# Patient Record
Sex: Female | Born: 1985
Health system: Southern US, Community
[De-identification: ages and names within clinical notes are randomized; demographics above are authoritative.]

## PROBLEM LIST (undated history)

## (undated) DIAGNOSIS — F419 Anxiety disorder, unspecified: Secondary | ICD-10-CM

## (undated) HISTORY — DX: Anxiety disorder, unspecified: F41.9

---
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
## 2007-12-11 IMAGING — CT CT ABDOMEN W/O CM
2 of 4 series · 17 of 46 positions shown, 19 images · IV contrast (agent unspecified)
Comparison: none

CLINICAL DATA: Right flank pain.
 ABDOMEN CT WITHOUT CONTRAST:
TECHNIQUE: Multidetector CT imaging of the abdomen was performed following the standard protocol without IV contrast.
TECHNIQUE: Multidetector CT imaging of the pelvis was performed following the standard protocol without IV contrast.

[Series 2: stone_wo 5.0 b40f st · axial · 0.53mm/px · z∈[-254,+94]mm · 14 of 95 slices shown, 16 images]
[im 4/95  soft-tissue]
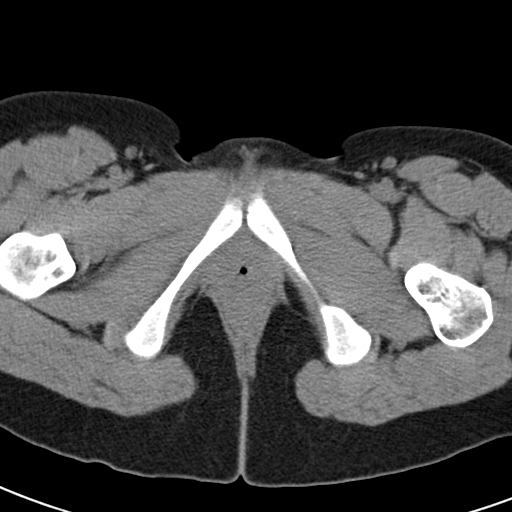
[im 4/95  bone]
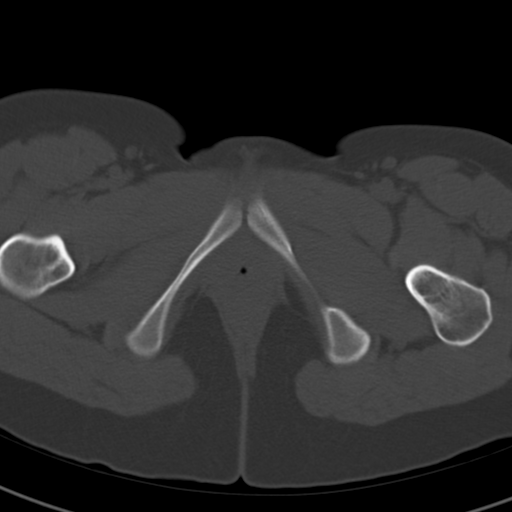
[im 11/95  soft-tissue]
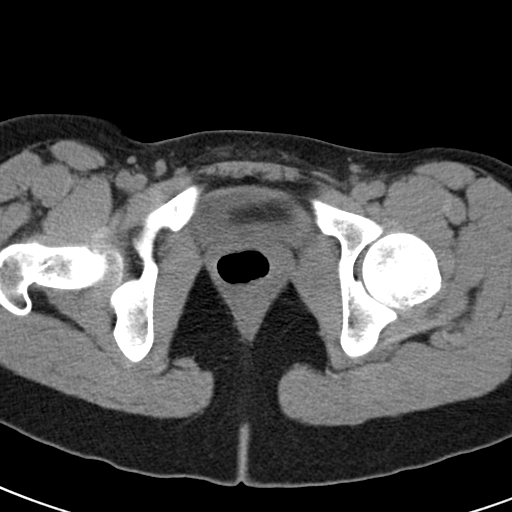
[im 18/95  soft-tissue]
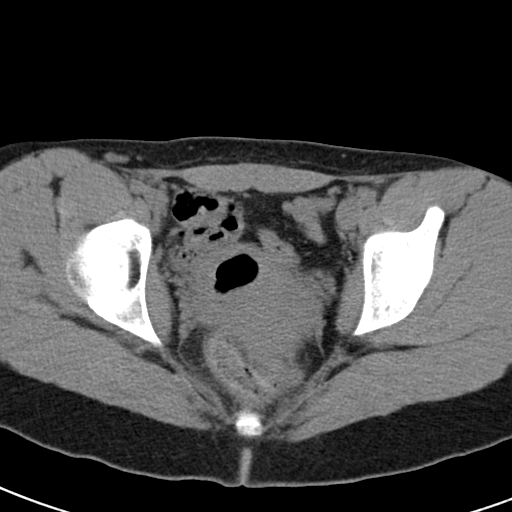
[im 25/95  soft-tissue]
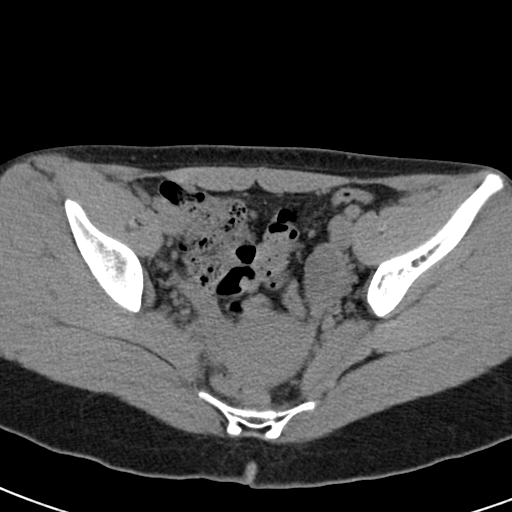
[im 32/95  soft-tissue]
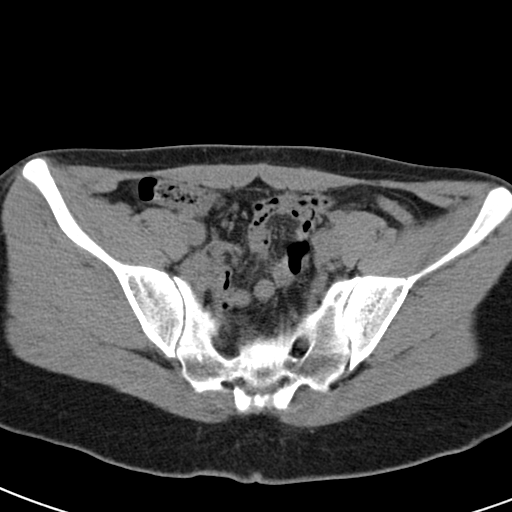
[im 39/95  soft-tissue]
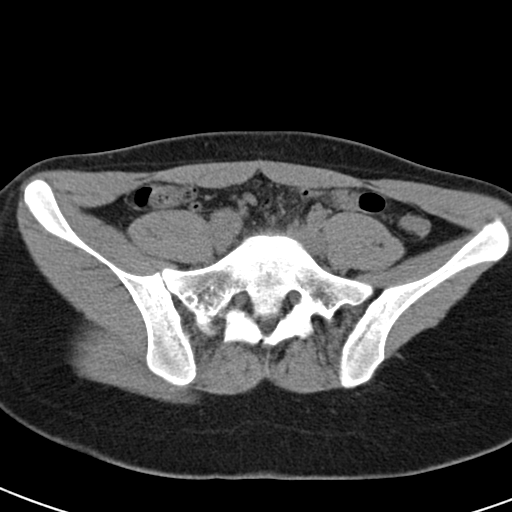
[im 46/95  soft-tissue]
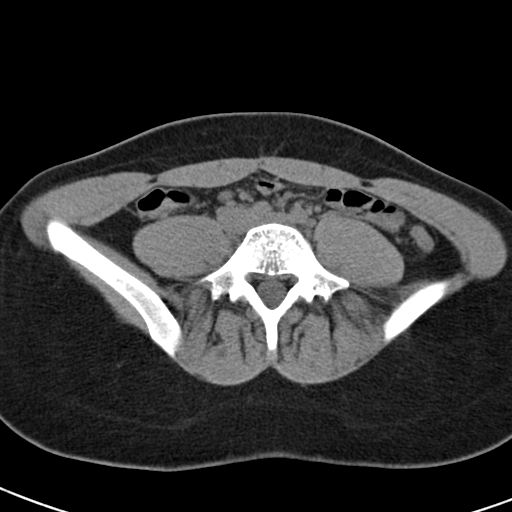
[im 49/95  soft-tissue]
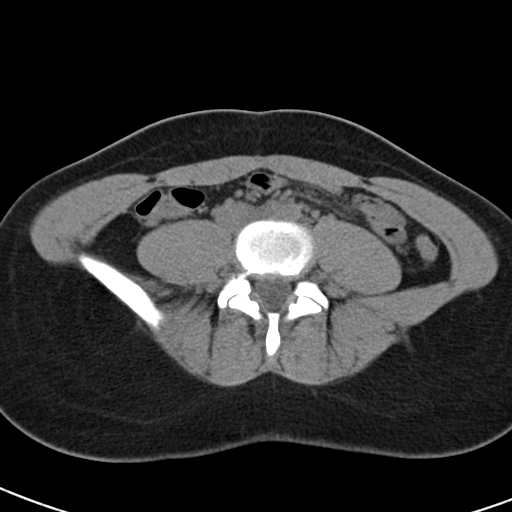
[im 56/95  soft-tissue]
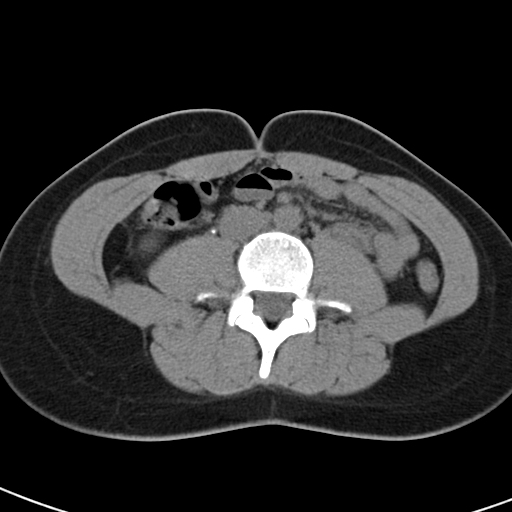
[im 56/95  bone]
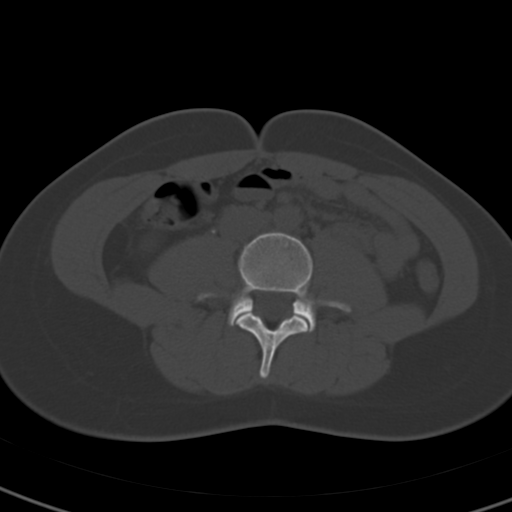
[im 63/95  soft-tissue]
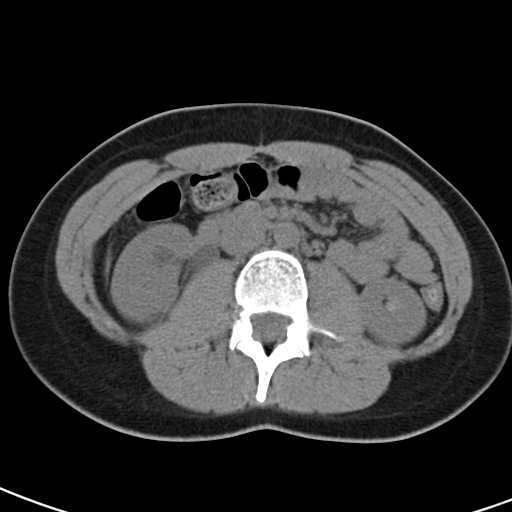
[im 70/95  soft-tissue]
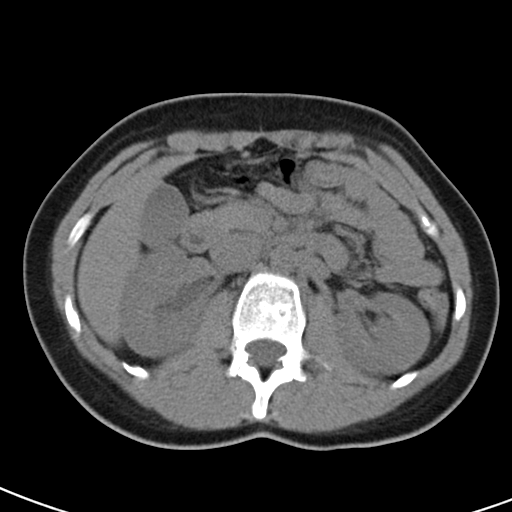
[im 77/95  soft-tissue]
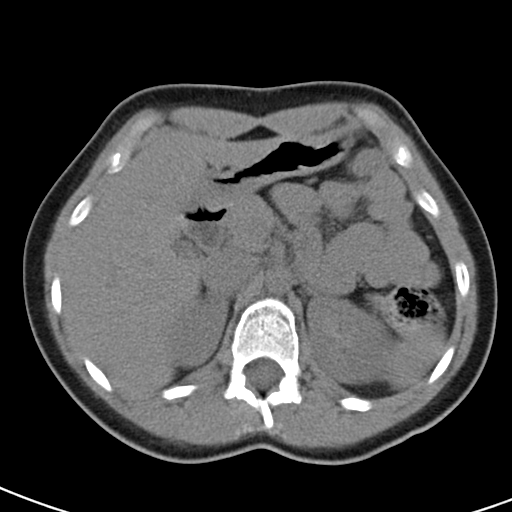
[im 84/95  soft-tissue]
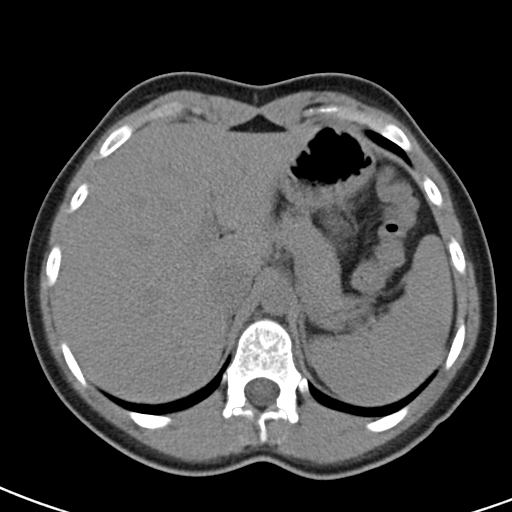
[im 91/95  soft-tissue]
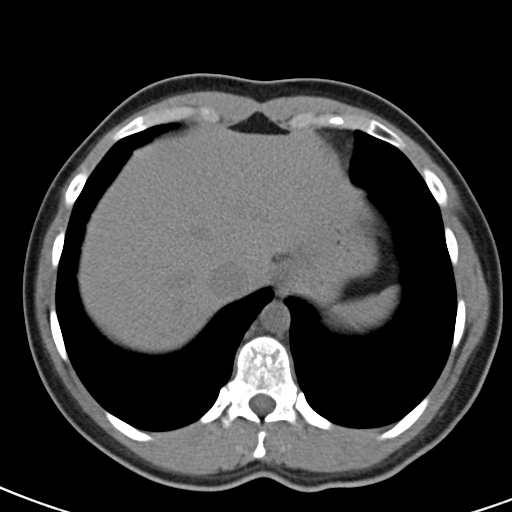

[Series 602: coronal · coronal · 0.77mm/px · 3 of 54 slices shown]
[im 18/54  soft-tissue]
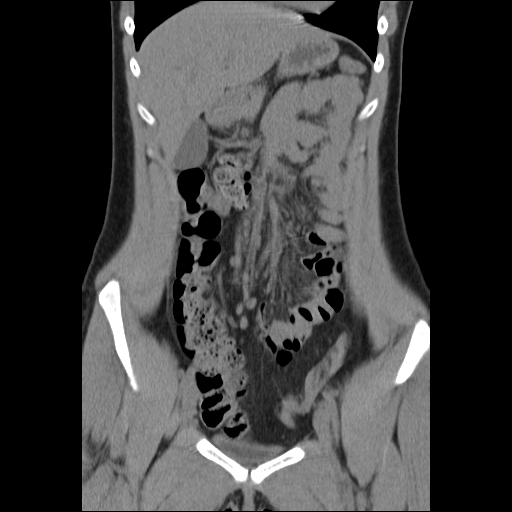
[im 24/54  soft-tissue]
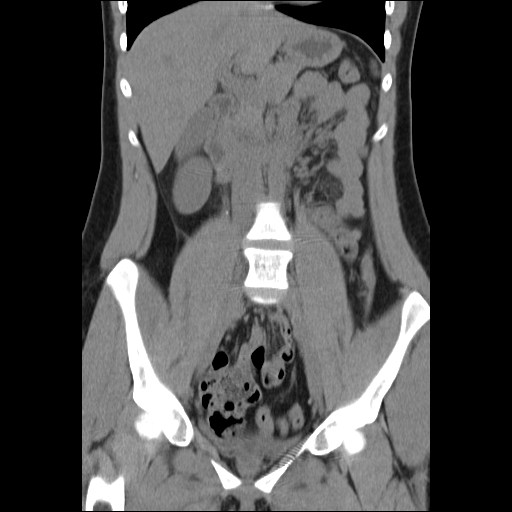
[im 30/54  soft-tissue]
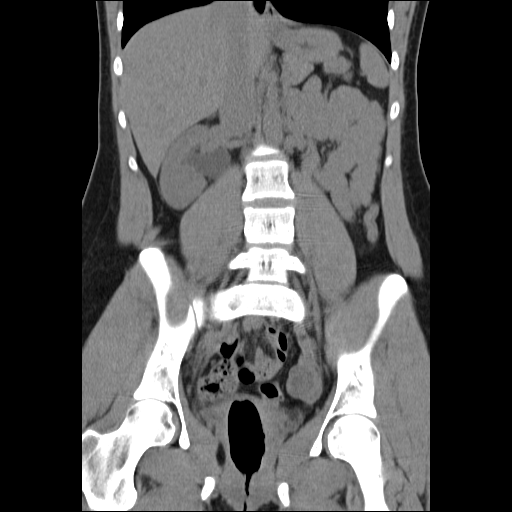

[17 of 46 positions shown; findings below may reference images not displayed]

FINDINGS: The lung bases are clear.  There is a mild right hydronephrosis and proximal hydroureter at point of obstruction by a 2 mm mid proximal right ureteral calculus.  No other definite renal calculi are seen.  The remainder of the study shows the liver to appear normal in the unenhanced state.  No calcified gallstones are seen.  The pancreas, adrenal glands and spleen appear normal.  The abdominal aorta is normal in caliber.
IMPRESSION: 1. Mild to moderate right hydronephrosis caused by mid proximal 2 mm right ureteral calculus.
 2. No other renal calculi are noted.
 PELVIS CT WITHOUT CONTRAST:
FINDINGS: Scans were continued through the pelvis in the unenhanced state.  The distal ureters are normal in caliber, and no distal ureteral calculi are noted.  The urinary bladder is decompressed.  The uterus is normal in size. Ovarian follicles are present, left more numerous than right.  No free fluid is noted.  The appendix appears normal.
IMPRESSION: No distal ureteral calculi are noted.  The appendix appears normal.

## 2017-04-05 DIAGNOSIS — F411 Generalized anxiety disorder: Secondary | ICD-10-CM | POA: Diagnosis not present

## 2017-04-15 DIAGNOSIS — F411 Generalized anxiety disorder: Secondary | ICD-10-CM | POA: Diagnosis not present

## 2017-04-19 DIAGNOSIS — Z0000 Encounter for general adult medical examination without abnormal findings: Secondary | ICD-10-CM | POA: Diagnosis not present

## 2017-04-19 DIAGNOSIS — R6889 Other general symptoms and signs: Secondary | ICD-10-CM | POA: Diagnosis not present

## 2017-04-19 DIAGNOSIS — R635 Abnormal weight gain: Secondary | ICD-10-CM | POA: Diagnosis not present

## 2017-04-19 DIAGNOSIS — R5383 Other fatigue: Secondary | ICD-10-CM | POA: Diagnosis not present

## 2017-04-20 DIAGNOSIS — F411 Generalized anxiety disorder: Secondary | ICD-10-CM | POA: Diagnosis not present

## 2017-04-29 DIAGNOSIS — F411 Generalized anxiety disorder: Secondary | ICD-10-CM | POA: Diagnosis not present

## 2017-05-02 DIAGNOSIS — F329 Major depressive disorder, single episode, unspecified: Secondary | ICD-10-CM | POA: Diagnosis not present

## 2017-05-05 DIAGNOSIS — F411 Generalized anxiety disorder: Secondary | ICD-10-CM | POA: Diagnosis not present

## 2017-05-09 DIAGNOSIS — F411 Generalized anxiety disorder: Secondary | ICD-10-CM | POA: Diagnosis not present

## 2017-05-13 DIAGNOSIS — F329 Major depressive disorder, single episode, unspecified: Secondary | ICD-10-CM | POA: Diagnosis not present

## 2017-05-19 DIAGNOSIS — F411 Generalized anxiety disorder: Secondary | ICD-10-CM | POA: Diagnosis not present

## 2017-06-02 DIAGNOSIS — F411 Generalized anxiety disorder: Secondary | ICD-10-CM | POA: Diagnosis not present

## 2017-06-07 DIAGNOSIS — F329 Major depressive disorder, single episode, unspecified: Secondary | ICD-10-CM | POA: Diagnosis not present

## 2017-06-09 DIAGNOSIS — F411 Generalized anxiety disorder: Secondary | ICD-10-CM | POA: Diagnosis not present

## 2017-06-14 DIAGNOSIS — H04123 Dry eye syndrome of bilateral lacrimal glands: Secondary | ICD-10-CM | POA: Diagnosis not present

## 2017-06-14 DIAGNOSIS — H5213 Myopia, bilateral: Secondary | ICD-10-CM | POA: Diagnosis not present

## 2017-06-16 DIAGNOSIS — F411 Generalized anxiety disorder: Secondary | ICD-10-CM | POA: Diagnosis not present

## 2017-06-29 DIAGNOSIS — F411 Generalized anxiety disorder: Secondary | ICD-10-CM | POA: Diagnosis not present

## 2017-12-16 DIAGNOSIS — Z6822 Body mass index (BMI) 22.0-22.9, adult: Secondary | ICD-10-CM | POA: Diagnosis not present

## 2017-12-16 DIAGNOSIS — Z3009 Encounter for other general counseling and advice on contraception: Secondary | ICD-10-CM | POA: Diagnosis not present

## 2017-12-16 DIAGNOSIS — Z113 Encounter for screening for infections with a predominantly sexual mode of transmission: Secondary | ICD-10-CM | POA: Diagnosis not present

## 2017-12-16 DIAGNOSIS — Z01419 Encounter for gynecological examination (general) (routine) without abnormal findings: Secondary | ICD-10-CM | POA: Diagnosis not present

## 2018-09-14 DIAGNOSIS — Z79899 Other long term (current) drug therapy: Secondary | ICD-10-CM | POA: Diagnosis not present

## 2018-09-14 DIAGNOSIS — F900 Attention-deficit hyperactivity disorder, predominantly inattentive type: Secondary | ICD-10-CM | POA: Diagnosis not present

## 2018-09-28 DIAGNOSIS — F411 Generalized anxiety disorder: Secondary | ICD-10-CM | POA: Diagnosis not present

## 2018-09-28 DIAGNOSIS — Z23 Encounter for immunization: Secondary | ICD-10-CM | POA: Diagnosis not present

## 2018-12-20 DIAGNOSIS — Z6822 Body mass index (BMI) 22.0-22.9, adult: Secondary | ICD-10-CM | POA: Diagnosis not present

## 2018-12-20 DIAGNOSIS — Z01419 Encounter for gynecological examination (general) (routine) without abnormal findings: Secondary | ICD-10-CM | POA: Diagnosis not present

## 2018-12-20 DIAGNOSIS — Z113 Encounter for screening for infections with a predominantly sexual mode of transmission: Secondary | ICD-10-CM | POA: Diagnosis not present

## 2018-12-20 DIAGNOSIS — Z3009 Encounter for other general counseling and advice on contraception: Secondary | ICD-10-CM | POA: Diagnosis not present

## 2019-02-23 DIAGNOSIS — D2261 Melanocytic nevi of right upper limb, including shoulder: Secondary | ICD-10-CM | POA: Diagnosis not present

## 2019-02-23 DIAGNOSIS — L821 Other seborrheic keratosis: Secondary | ICD-10-CM | POA: Diagnosis not present

## 2019-09-10 DIAGNOSIS — U071 COVID-19: Secondary | ICD-10-CM | POA: Diagnosis not present

## 2019-09-10 DIAGNOSIS — R079 Chest pain, unspecified: Secondary | ICD-10-CM | POA: Diagnosis not present

## 2019-09-10 DIAGNOSIS — Z20822 Contact with and (suspected) exposure to covid-19: Secondary | ICD-10-CM | POA: Diagnosis not present

## 2019-12-14 DIAGNOSIS — Z0000 Encounter for general adult medical examination without abnormal findings: Secondary | ICD-10-CM | POA: Diagnosis not present

## 2019-12-14 DIAGNOSIS — Z13220 Encounter for screening for lipoid disorders: Secondary | ICD-10-CM | POA: Diagnosis not present

## 2019-12-14 DIAGNOSIS — S0502XA Injury of conjunctiva and corneal abrasion without foreign body, left eye, initial encounter: Secondary | ICD-10-CM | POA: Diagnosis not present

## 2019-12-21 DIAGNOSIS — F411 Generalized anxiety disorder: Secondary | ICD-10-CM | POA: Diagnosis not present

## 2019-12-21 DIAGNOSIS — R7989 Other specified abnormal findings of blood chemistry: Secondary | ICD-10-CM | POA: Diagnosis not present

## 2019-12-21 DIAGNOSIS — F900 Attention-deficit hyperactivity disorder, predominantly inattentive type: Secondary | ICD-10-CM | POA: Diagnosis not present

## 2019-12-21 DIAGNOSIS — Z0000 Encounter for general adult medical examination without abnormal findings: Secondary | ICD-10-CM | POA: Diagnosis not present

## 2020-01-28 DIAGNOSIS — Z01419 Encounter for gynecological examination (general) (routine) without abnormal findings: Secondary | ICD-10-CM | POA: Diagnosis not present

## 2020-01-28 DIAGNOSIS — Z6823 Body mass index (BMI) 23.0-23.9, adult: Secondary | ICD-10-CM | POA: Diagnosis not present

## 2020-01-28 DIAGNOSIS — Z3009 Encounter for other general counseling and advice on contraception: Secondary | ICD-10-CM | POA: Diagnosis not present

## 2020-02-08 DIAGNOSIS — R7989 Other specified abnormal findings of blood chemistry: Secondary | ICD-10-CM | POA: Diagnosis not present

## 2020-02-08 DIAGNOSIS — F411 Generalized anxiety disorder: Secondary | ICD-10-CM | POA: Diagnosis not present

## 2020-02-20 DIAGNOSIS — N72 Inflammatory disease of cervix uteri: Secondary | ICD-10-CM | POA: Diagnosis not present

## 2020-02-20 DIAGNOSIS — B977 Papillomavirus as the cause of diseases classified elsewhere: Secondary | ICD-10-CM | POA: Diagnosis not present

## 2020-02-20 DIAGNOSIS — R87618 Other abnormal cytological findings on specimens from cervix uteri: Secondary | ICD-10-CM | POA: Diagnosis not present

## 2020-04-21 DIAGNOSIS — D225 Melanocytic nevi of trunk: Secondary | ICD-10-CM | POA: Diagnosis not present

## 2020-04-21 DIAGNOSIS — D2261 Melanocytic nevi of right upper limb, including shoulder: Secondary | ICD-10-CM | POA: Diagnosis not present

## 2020-04-21 DIAGNOSIS — D224 Melanocytic nevi of scalp and neck: Secondary | ICD-10-CM | POA: Diagnosis not present

## 2020-04-21 DIAGNOSIS — D2262 Melanocytic nevi of left upper limb, including shoulder: Secondary | ICD-10-CM | POA: Diagnosis not present

## 2020-06-09 DIAGNOSIS — Z3009 Encounter for other general counseling and advice on contraception: Secondary | ICD-10-CM | POA: Diagnosis not present

## 2020-12-22 DIAGNOSIS — Z0000 Encounter for general adult medical examination without abnormal findings: Secondary | ICD-10-CM | POA: Diagnosis not present

## 2020-12-24 DIAGNOSIS — Z0000 Encounter for general adult medical examination without abnormal findings: Secondary | ICD-10-CM | POA: Diagnosis not present

## 2020-12-24 DIAGNOSIS — D72819 Decreased white blood cell count, unspecified: Secondary | ICD-10-CM | POA: Diagnosis not present

## 2020-12-24 DIAGNOSIS — R7989 Other specified abnormal findings of blood chemistry: Secondary | ICD-10-CM | POA: Diagnosis not present

## 2020-12-24 DIAGNOSIS — F900 Attention-deficit hyperactivity disorder, predominantly inattentive type: Secondary | ICD-10-CM | POA: Diagnosis not present

## 2020-12-24 DIAGNOSIS — F411 Generalized anxiety disorder: Secondary | ICD-10-CM | POA: Diagnosis not present

## 2020-12-30 ENCOUNTER — Telehealth: Payer: Self-pay | Admitting: Hematology and Oncology

## 2020-12-30 NOTE — Telephone Encounter (Signed)
Scheduled appt per 8/26 referral. Pt is aware of appt date and time.

## 2021-01-01 DIAGNOSIS — F4323 Adjustment disorder with mixed anxiety and depressed mood: Secondary | ICD-10-CM | POA: Diagnosis not present

## 2021-01-12 DIAGNOSIS — F4323 Adjustment disorder with mixed anxiety and depressed mood: Secondary | ICD-10-CM | POA: Diagnosis not present

## 2021-01-19 ENCOUNTER — Encounter: Payer: Self-pay | Admitting: Hematology and Oncology

## 2021-01-19 ENCOUNTER — Inpatient Hospital Stay (HOSPITAL_BASED_OUTPATIENT_CLINIC_OR_DEPARTMENT_OTHER): Payer: BC Managed Care – PPO | Admitting: Hematology and Oncology

## 2021-01-19 ENCOUNTER — Other Ambulatory Visit: Payer: Self-pay

## 2021-01-19 ENCOUNTER — Inpatient Hospital Stay: Payer: BC Managed Care – PPO | Attending: Hematology and Oncology

## 2021-01-19 VITALS — BP 134/84 | HR 106 | Temp 98.4°F | Resp 18 | Wt 136.0 lb

## 2021-01-19 DIAGNOSIS — R590 Localized enlarged lymph nodes: Secondary | ICD-10-CM | POA: Insufficient documentation

## 2021-01-19 DIAGNOSIS — Z803 Family history of malignant neoplasm of breast: Secondary | ICD-10-CM | POA: Diagnosis not present

## 2021-01-19 DIAGNOSIS — D72819 Decreased white blood cell count, unspecified: Secondary | ICD-10-CM | POA: Diagnosis not present

## 2021-01-19 DIAGNOSIS — Z79899 Other long term (current) drug therapy: Secondary | ICD-10-CM | POA: Diagnosis not present

## 2021-01-19 DIAGNOSIS — Z880 Allergy status to penicillin: Secondary | ICD-10-CM

## 2021-01-19 DIAGNOSIS — Z808 Family history of malignant neoplasm of other organs or systems: Secondary | ICD-10-CM | POA: Insufficient documentation

## 2021-01-19 DIAGNOSIS — R7989 Other specified abnormal findings of blood chemistry: Secondary | ICD-10-CM | POA: Insufficient documentation

## 2021-01-19 DIAGNOSIS — F411 Generalized anxiety disorder: Secondary | ICD-10-CM

## 2021-01-19 DIAGNOSIS — R591 Generalized enlarged lymph nodes: Secondary | ICD-10-CM

## 2021-01-19 LAB — CMP (CANCER CENTER ONLY)
ALT: 29 U/L (ref 0–44)
AST: 25 U/L (ref 15–41)
Albumin: 4.2 g/dL (ref 3.5–5.0)
Alkaline Phosphatase: 58 U/L (ref 38–126)
Anion gap: 9 (ref 5–15)
BUN: 13 mg/dL (ref 6–20)
CO2: 27 mmol/L (ref 22–32)
Calcium: 10.2 mg/dL (ref 8.9–10.3)
Chloride: 106 mmol/L (ref 98–111)
Creatinine: 1.09 mg/dL — ABNORMAL HIGH (ref 0.44–1.00)
GFR, Estimated: >60 mL/min (ref >60)
Glucose, Bld: 103 mg/dL — ABNORMAL HIGH (ref 70–99)
Potassium: 4.1 mmol/L (ref 3.5–5.1)
Sodium: 142 mmol/L (ref 135–145)
Total Bilirubin: 0.3 mg/dL (ref 0.3–1.2)
Total Protein: 7.3 g/dL (ref 6.5–8.1)

## 2021-01-19 LAB — CBC WITH DIFFERENTIAL (CANCER CENTER ONLY)
Abs Immature Granulocytes: 0.01 10*3/uL (ref 0.00–0.07)
Basophils Absolute: 0.1 10*3/uL (ref 0.0–0.1)
Basophils Relative: 1 %
Eosinophils Absolute: 0.1 10*3/uL (ref 0.0–0.5)
Eosinophils Relative: 1 %
HCT: 39.2 % (ref 36.0–46.0)
Hemoglobin: 13.8 g/dL (ref 12.0–15.0)
Immature Granulocytes: 0 %
Lymphocytes Relative: 35 %
Lymphs Abs: 2 10*3/uL (ref 0.7–4.0)
MCH: 31.4 pg (ref 26.0–34.0)
MCHC: 35.2 g/dL (ref 30.0–36.0)
MCV: 89.1 fL (ref 80.0–100.0)
Monocytes Absolute: 0.4 10*3/uL (ref 0.1–1.0)
Monocytes Relative: 6 %
Neutro Abs: 3.1 10*3/uL (ref 1.7–7.7)
Neutrophils Relative %: 57 %
Platelet Count: 242 10*3/uL (ref 150–400)
RBC: 4.4 MIL/uL (ref 3.87–5.11)
RDW: 11.7 % (ref 11.5–15.5)
WBC Count: 5.6 10*3/uL (ref 4.0–10.5)
nRBC: 0 % (ref 0.0–0.2)

## 2021-01-19 LAB — LACTATE DEHYDROGENASE: LDH: 89 U/L — ABNORMAL LOW (ref 98–192)

## 2021-01-19 LAB — SAVE SMEAR(SSMR), FOR PROVIDER SLIDE REVIEW

## 2021-01-19 LAB — C-REACTIVE PROTEIN: CRP: 0.8 mg/dL (ref <1.0)

## 2021-01-19 LAB — SEDIMENTATION RATE: Sed Rate: 2 mm/hr (ref 0–22)

## 2021-01-19 NOTE — Progress Notes (Signed)
[facility] Telephone:([phone]) [phone]   Fax:([phone]  INITIAL CONSULT NOTE  Patient Care Team: Deland Pretty, MD as PCP - General (Internal Medicine)  Hematological/Oncological History # Cervical/Axillary Lymphadenopathy 12/22/2020: WBC 3.9, Hgb 14.1, MCV 90.5, Plt 237 01/19/2021: establish care with Dr. Lorenso Courier   CHIEF COMPLAINTS/PURPOSE OF CONSULTATION:  "Lymphadenopathy "  HISTORY OF PRESENTING ILLNESS:  Natalie Roberts 35 y.o. female with medical history significant for general anxiety disorder who presents for evaluation of mild leukopenia and left cervical/axillary lymphadenopathy noted on exam.   On review of the previous records Natalie Roberts was last seen by her PCP on 12/22/2020.  At that time she had a white blood cell count of 3.9, hemoglobin 14.1, MCV of 90.5, platelets of 237.  Additionally she was noted to have a mildly elevated creatinine at 1.07.  It was noted that she had mild lymphadenopathy in her left cervical lymph nodes and left axilla.  Due to concern for these findings the patient was referred to hematology for further evaluation and management.  On exam today Natalie Roberts reports that she first noticed a pea-sized swollen lymph node in her neck approximately 3 months ago.  She notes it took her about 2 months to get an appointment with her primary care provider who was able to feel this lymph node as well as a lymph node under her left arm.  She notes that she has not had any recent illnesses and did have her COVID-vaccine over a year ago.  She notes that the only other symptom she is having so the ordinary is that itching on her hands and feet that been chronic for 1 year and has had difficulty resolving.  It is not impacted any rashes or skin changes in her hands.  On further discussion she notes that she is a never smoker and has not had any alcohol drink in the last 6 weeks.  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  Her family history is  remarkable for breast cancer in her maternal grandmother and skin cancer in her maternal grandfather.  She otherwise denies any fevers, chills, sweats, nausea vomiting or diarrhea.  She denies any weight loss.  A full 10 point ROS is listed below.  MEDICAL HISTORY:  Past Medical History:  Diagnosis Date   Anxiety     SURGICAL HISTORY: History reviewed. No pertinent surgical history.  SOCIAL HISTORY: Social History   Socioeconomic History   Marital status: Single    Spouse name: Not on file   Number of children: Not on file   Years of education: Not on file   Highest education level: Not on file  Occupational History   Not on file  Tobacco Use   Smoking status: Never   Smokeless tobacco: Never  Vaping Use   Vaping Use: Never used  Substance and Sexual Activity   Alcohol use: Not Currently   Drug use: Not on file   Sexual activity: Not on file  Other Topics Concern   Not on file  Social History Narrative   Not on file   Social Determinants of Health   Financial Resource Strain: Not on file  Food Insecurity: Not on file  Transportation Needs: Not on file  Physical Activity: Not on file  Stress: Not on file  Social Connections: Not on file  Intimate Partner Violence: Not on file    FAMILY HISTORY: Family History  Problem Relation Age of Onset   Breast cancer Maternal Grandmother  Skin cancer Maternal Grandfather     ALLERGIES:  is allergic to penicillins.  MEDICATIONS:  Current Outpatient Medications  Medication Sig Dispense Refill   ALPRAZolam (XANAX) 0.5 MG tablet 1 tablet     buPROPion (WELLBUTRIN XL) 300 MG 24 hr tablet Take 1 tablet by mouth daily.     B COMPLEX VITAMINS PO Take 1 tablet by mouth daily.     Cholecalciferol 50 MCG (2000 UT) TABS 1 tablet     L-Theanine 200 MG CAPS See admin instructions.     LO LOESTRIN FE 1 MG-10 MCG / 10 MCG tablet Take 1 tablet by mouth daily.     Magnesium 300 MG CAPS Take by mouth.     PROBIOTIC PRODUCT PO Take  by mouth.     No current facility-administered medications for this visit.    REVIEW OF SYSTEMS:   Constitutional: ( - ) fevers, ( - )  chills , ( - ) night sweats Eyes: ( - ) blurriness of vision, ( - ) double vision, ( - ) watery eyes Ears, nose, mouth, throat, and face: ( - ) mucositis, ( - ) sore throat Respiratory: ( - ) cough, ( - ) dyspnea, ( - ) wheezes Cardiovascular: ( - ) palpitation, ( - ) chest discomfort, ( - ) lower extremity swelling Gastrointestinal:  ( - ) nausea, ( - ) heartburn, ( - ) change in bowel habits Skin: ( - ) abnormal skin rashes Lymphatics: ( - ) new lymphadenopathy, ( - ) easy bruising Neurological: ( - ) numbness, ( - ) tingling, ( - ) new weaknesses Behavioral/Psych: ( - ) mood change, ( - ) new changes  All other systems were reviewed with the patient and are negative.  PHYSICAL EXAMINATION:  Vitals:   01/19/21 1420  BP: 134/84  Pulse: (!) 106  Resp: 18  Temp: 98.4 F (36.9 C)  SpO2: 98%   Filed Weights   01/19/21 1420  Weight: 136 lb (61.7 kg)    GENERAL: well appearing middle-aged Caucasian female in NAD  SKIN: skin color, texture, turgor are normal, no rashes or significant lesions EYES: conjunctiva are pink and non-injected, sclera clear LUNGS: clear to auscultation and percussion with normal breathing effort HEART: regular rate & rhythm and no murmurs and no lower extremity edema Musculoskeletal: no cyanosis of digits and no clubbing  PSYCH: alert & oriented x 3, fluent speech NEURO: no focal motor/sensory deficits  LABORATORY DATA:  I have reviewed the data as listed CBC Latest Ref Rng & Units 01/19/2021  WBC 4.0 - 10.5 K/uL 5.6  Hemoglobin 12.0 - 15.0 g/dL 13.8  Hematocrit 36.0 - 46.0 % 39.2  Platelets 150 - 400 K/uL 242    No flowsheet data found.   RADIOGRAPHIC STUDIES: No results found.  ASSESSMENT & PLAN Natalie Roberts 35 y.o. female with medical history significant for general anxiety disorder who presents for  evaluation of mild leukopenia and left cervical/axillary lymphadenopathy noted on exam.   After review of the labs, review of the records, and discussion with the patient the patients findings are most consistent with a mild lymphadenopathy of the left cervical lymph nodes.  No axillary lymphadenopathy was palpated on my personal exam.  Overall given the relatively small size lymph nodes and lack of prominent nodes I have a low suspicion that this represents a more serious condition.  At this time we will do a thorough work-up to include CBC, CMP, ESR, CRP, and flow cytometry.  Pending  results of the studies we can consider further evaluation with a CT scan of the neck.  Otherwise I think these findings are quite mild and do not represent a more serious underlying condition.  # Left Cervical/Axillary Lymphadenopathy -- Findings at this time appear benign with only a single pea-sized lymph node palpated on exam --Recommend CBC and CMP --Additional lab work to include ESR, CRP, flow cytometry, and save smear --Pending the results of the blood studies we can consider a CT scan of the neck in order to better assess these lymph nodes --Return to clinic pending the results of the above studies.  Orders Placed This Encounter  Procedures   CBC with Differential ([facility] Only)    Standing Status:   Future    Number of Occurrences:   1    Standing Expiration Date:   01/19/2022   CMP ([facility] only)    Standing Status:   Future    Number of Occurrences:   1    Standing Expiration Date:   01/19/2022   Lactate dehydrogenase (LDH)    Standing Status:   Future    Number of Occurrences:   1    Standing Expiration Date:   01/19/2022   Sedimentation rate    Standing Status:   Future    Number of Occurrences:   1    Standing Expiration Date:   01/19/2022   C-reactive protein    Standing Status:   Future    Number of Occurrences:   1    Standing Expiration Date:   01/19/2022   Flow Cytometry,  Peripheral Blood (Oncology)    Standing Status:   Future    Number of Occurrences:   1    Standing Expiration Date:   01/19/2022   Save Smear (SSMR)    Standing Status:   Future    Number of Occurrences:   1    Standing Expiration Date:   01/19/2022    All questions were answered. The patient knows to call the clinic with any problems, questions or concerns.  A total of more than 60 minutes were spent on this encounter with face-to-face time and non-face-to-face time, including preparing to see the patient, ordering tests and/or medications, counseling the patient and coordination of care as outlined above.   Ledell Peoples, MD Department of Hematology/Oncology [place] at [facility] Phone: [phone] Pager: [phone] Email: [email or web address]  01/19/2021 3:18 PM

## 2021-01-21 LAB — SURGICAL PATHOLOGY

## 2021-01-23 LAB — FLOW CYTOMETRY

## 2021-01-26 ENCOUNTER — Telehealth: Payer: Self-pay | Admitting: *Deleted

## 2021-01-26 NOTE — Telephone Encounter (Signed)
-----   Message from Jaci Standard, MD sent at 01/23/2021  3:58 PM EDT ----- Please let Natalie Roberts know that her bloodwork was reassuring. The lymph nodes palpated on exam were quite small. I do not think routine f/u is required unless they were to get larger or if she developed new symptoms. I do not think imaging is required at this time based on physical exam. Please have her call or message if there were to be any concerning changes.   ----- Message ----- From: Leory Plowman, [facility] Sent: 01/19/2021   3:14 PM EDT To: Jaci Standard, MD

## 2021-01-26 NOTE — Telephone Encounter (Signed)
TCT patient regarding recent lab results. Spoke with her and advised that her lab results were normal. Dr. Leonides Schanz does not believe she requires any scans due the lymph nodes she feels. These are very small. Advised for her to call back if anything changes in these lymph nodes. Pt voiced understanding.

## 2021-01-27 DIAGNOSIS — F4323 Adjustment disorder with mixed anxiety and depressed mood: Secondary | ICD-10-CM | POA: Diagnosis not present

## 2021-02-10 DIAGNOSIS — F4323 Adjustment disorder with mixed anxiety and depressed mood: Secondary | ICD-10-CM | POA: Diagnosis not present

## 2021-02-18 DIAGNOSIS — Z566 Other physical and mental strain related to work: Secondary | ICD-10-CM | POA: Diagnosis not present

## 2021-02-18 DIAGNOSIS — Z01419 Encounter for gynecological examination (general) (routine) without abnormal findings: Secondary | ICD-10-CM | POA: Diagnosis not present

## 2021-02-18 DIAGNOSIS — Z8659 Personal history of other mental and behavioral disorders: Secondary | ICD-10-CM | POA: Diagnosis not present

## 2021-02-18 DIAGNOSIS — Z6824 Body mass index (BMI) 24.0-24.9, adult: Secondary | ICD-10-CM | POA: Diagnosis not present

## 2021-02-24 DIAGNOSIS — F4323 Adjustment disorder with mixed anxiety and depressed mood: Secondary | ICD-10-CM | POA: Diagnosis not present

## 2021-03-02 DIAGNOSIS — R87810 Cervical high risk human papillomavirus (HPV) DNA test positive: Secondary | ICD-10-CM | POA: Diagnosis not present

## 2021-03-10 DIAGNOSIS — F4323 Adjustment disorder with mixed anxiety and depressed mood: Secondary | ICD-10-CM | POA: Diagnosis not present

## 2021-04-07 DIAGNOSIS — F4323 Adjustment disorder with mixed anxiety and depressed mood: Secondary | ICD-10-CM | POA: Diagnosis not present

## 2021-05-11 DIAGNOSIS — R41840 Attention and concentration deficit: Secondary | ICD-10-CM | POA: Diagnosis not present

## 2021-05-11 DIAGNOSIS — F411 Generalized anxiety disorder: Secondary | ICD-10-CM | POA: Diagnosis not present

## 2021-05-11 DIAGNOSIS — Z79899 Other long term (current) drug therapy: Secondary | ICD-10-CM | POA: Diagnosis not present

## 2021-05-11 DIAGNOSIS — F330 Major depressive disorder, recurrent, mild: Secondary | ICD-10-CM | POA: Diagnosis not present

## 2021-05-12 DIAGNOSIS — Z23 Encounter for immunization: Secondary | ICD-10-CM | POA: Diagnosis not present

## 2021-05-12 DIAGNOSIS — F411 Generalized anxiety disorder: Secondary | ICD-10-CM | POA: Diagnosis not present

## 2021-05-12 DIAGNOSIS — Z136 Encounter for screening for cardiovascular disorders: Secondary | ICD-10-CM | POA: Diagnosis not present

## 2021-05-12 DIAGNOSIS — Z87442 Personal history of urinary calculi: Secondary | ICD-10-CM | POA: Diagnosis not present

## 2021-05-12 DIAGNOSIS — R002 Palpitations: Secondary | ICD-10-CM | POA: Diagnosis not present

## 2021-05-19 DIAGNOSIS — F4323 Adjustment disorder with mixed anxiety and depressed mood: Secondary | ICD-10-CM | POA: Diagnosis not present

## 2021-05-22 DIAGNOSIS — L719 Rosacea, unspecified: Secondary | ICD-10-CM | POA: Diagnosis not present

## 2021-05-22 DIAGNOSIS — I781 Nevus, non-neoplastic: Secondary | ICD-10-CM | POA: Diagnosis not present

## 2021-05-22 DIAGNOSIS — D1800 Hemangioma unspecified site: Secondary | ICD-10-CM | POA: Diagnosis not present

## 2021-05-22 DIAGNOSIS — Z1283 Encounter for screening for malignant neoplasm of skin: Secondary | ICD-10-CM | POA: Diagnosis not present

## 2021-06-02 DIAGNOSIS — F4323 Adjustment disorder with mixed anxiety and depressed mood: Secondary | ICD-10-CM | POA: Diagnosis not present

## 2021-06-15 DIAGNOSIS — F330 Major depressive disorder, recurrent, mild: Secondary | ICD-10-CM | POA: Diagnosis not present

## 2021-06-15 DIAGNOSIS — Z79899 Other long term (current) drug therapy: Secondary | ICD-10-CM | POA: Diagnosis not present

## 2021-06-15 DIAGNOSIS — R41840 Attention and concentration deficit: Secondary | ICD-10-CM | POA: Diagnosis not present

## 2021-06-15 DIAGNOSIS — F411 Generalized anxiety disorder: Secondary | ICD-10-CM | POA: Diagnosis not present

## 2021-06-16 DIAGNOSIS — F4323 Adjustment disorder with mixed anxiety and depressed mood: Secondary | ICD-10-CM | POA: Diagnosis not present

## 2021-06-30 DIAGNOSIS — F4323 Adjustment disorder with mixed anxiety and depressed mood: Secondary | ICD-10-CM | POA: Diagnosis not present

## 2021-07-14 DIAGNOSIS — F4323 Adjustment disorder with mixed anxiety and depressed mood: Secondary | ICD-10-CM | POA: Diagnosis not present

## 2021-07-20 DIAGNOSIS — R41840 Attention and concentration deficit: Secondary | ICD-10-CM | POA: Diagnosis not present

## 2021-07-20 DIAGNOSIS — F411 Generalized anxiety disorder: Secondary | ICD-10-CM | POA: Diagnosis not present

## 2021-07-20 DIAGNOSIS — Z79899 Other long term (current) drug therapy: Secondary | ICD-10-CM | POA: Diagnosis not present

## 2021-07-20 DIAGNOSIS — F330 Major depressive disorder, recurrent, mild: Secondary | ICD-10-CM | POA: Diagnosis not present

## 2021-08-25 DIAGNOSIS — F4323 Adjustment disorder with mixed anxiety and depressed mood: Secondary | ICD-10-CM | POA: Diagnosis not present

## 2021-09-08 DIAGNOSIS — F4323 Adjustment disorder with mixed anxiety and depressed mood: Secondary | ICD-10-CM | POA: Diagnosis not present

## 2021-10-20 DIAGNOSIS — F4323 Adjustment disorder with mixed anxiety and depressed mood: Secondary | ICD-10-CM | POA: Diagnosis not present

## 2021-10-21 DIAGNOSIS — R41840 Attention and concentration deficit: Secondary | ICD-10-CM | POA: Diagnosis not present

## 2021-10-21 DIAGNOSIS — F3341 Major depressive disorder, recurrent, in partial remission: Secondary | ICD-10-CM | POA: Diagnosis not present

## 2021-10-21 DIAGNOSIS — Z79899 Other long term (current) drug therapy: Secondary | ICD-10-CM | POA: Diagnosis not present

## 2021-11-05 DIAGNOSIS — F4323 Adjustment disorder with mixed anxiety and depressed mood: Secondary | ICD-10-CM | POA: Diagnosis not present

## 2021-12-03 DIAGNOSIS — F4323 Adjustment disorder with mixed anxiety and depressed mood: Secondary | ICD-10-CM | POA: Diagnosis not present

## 2021-12-16 DIAGNOSIS — F4323 Adjustment disorder with mixed anxiety and depressed mood: Secondary | ICD-10-CM | POA: Diagnosis not present

## 2021-12-25 DIAGNOSIS — Z0000 Encounter for general adult medical examination without abnormal findings: Secondary | ICD-10-CM | POA: Diagnosis not present

## 2021-12-29 DIAGNOSIS — F900 Attention-deficit hyperactivity disorder, predominantly inattentive type: Secondary | ICD-10-CM | POA: Diagnosis not present

## 2021-12-29 DIAGNOSIS — R002 Palpitations: Secondary | ICD-10-CM | POA: Diagnosis not present

## 2021-12-29 DIAGNOSIS — Z0000 Encounter for general adult medical examination without abnormal findings: Secondary | ICD-10-CM | POA: Diagnosis not present

## 2021-12-29 DIAGNOSIS — I493 Ventricular premature depolarization: Secondary | ICD-10-CM | POA: Diagnosis not present

## 2021-12-29 DIAGNOSIS — K219 Gastro-esophageal reflux disease without esophagitis: Secondary | ICD-10-CM | POA: Diagnosis not present

## 2022-01-05 DIAGNOSIS — F4323 Adjustment disorder with mixed anxiety and depressed mood: Secondary | ICD-10-CM | POA: Diagnosis not present

## 2022-01-19 DIAGNOSIS — Z79899 Other long term (current) drug therapy: Secondary | ICD-10-CM | POA: Diagnosis not present

## 2022-01-19 DIAGNOSIS — F3341 Major depressive disorder, recurrent, in partial remission: Secondary | ICD-10-CM | POA: Diagnosis not present

## 2022-01-19 DIAGNOSIS — R41840 Attention and concentration deficit: Secondary | ICD-10-CM | POA: Diagnosis not present

## 2022-01-20 DIAGNOSIS — F4323 Adjustment disorder with mixed anxiety and depressed mood: Secondary | ICD-10-CM | POA: Diagnosis not present

## 2022-02-02 DIAGNOSIS — F4323 Adjustment disorder with mixed anxiety and depressed mood: Secondary | ICD-10-CM | POA: Diagnosis not present

## 2022-02-16 DIAGNOSIS — F4323 Adjustment disorder with mixed anxiety and depressed mood: Secondary | ICD-10-CM | POA: Diagnosis not present

## 2022-03-02 DIAGNOSIS — F4323 Adjustment disorder with mixed anxiety and depressed mood: Secondary | ICD-10-CM | POA: Diagnosis not present

## 2022-03-03 DIAGNOSIS — E282 Polycystic ovarian syndrome: Secondary | ICD-10-CM | POA: Diagnosis not present

## 2022-03-03 DIAGNOSIS — Z01419 Encounter for gynecological examination (general) (routine) without abnormal findings: Secondary | ICD-10-CM | POA: Diagnosis not present

## 2022-03-03 DIAGNOSIS — Z6829 Body mass index (BMI) 29.0-29.9, adult: Secondary | ICD-10-CM | POA: Diagnosis not present

## 2022-03-03 DIAGNOSIS — E669 Obesity, unspecified: Secondary | ICD-10-CM | POA: Diagnosis not present

## 2022-03-19 DIAGNOSIS — F411 Generalized anxiety disorder: Secondary | ICD-10-CM | POA: Diagnosis not present

## 2022-03-19 DIAGNOSIS — Z713 Dietary counseling and surveillance: Secondary | ICD-10-CM | POA: Diagnosis not present

## 2022-03-19 DIAGNOSIS — E663 Overweight: Secondary | ICD-10-CM | POA: Diagnosis not present

## 2022-03-30 DIAGNOSIS — F4323 Adjustment disorder with mixed anxiety and depressed mood: Secondary | ICD-10-CM | POA: Diagnosis not present

## 2022-04-12 DIAGNOSIS — Z79899 Other long term (current) drug therapy: Secondary | ICD-10-CM | POA: Diagnosis not present

## 2022-04-12 DIAGNOSIS — F909 Attention-deficit hyperactivity disorder, unspecified type: Secondary | ICD-10-CM | POA: Diagnosis not present

## 2022-04-20 DIAGNOSIS — F4323 Adjustment disorder with mixed anxiety and depressed mood: Secondary | ICD-10-CM | POA: Diagnosis not present

## 2022-10-06 DIAGNOSIS — N926 Irregular menstruation, unspecified: Secondary | ICD-10-CM | POA: Diagnosis not present

## 2022-10-06 DIAGNOSIS — R5383 Other fatigue: Secondary | ICD-10-CM | POA: Diagnosis not present

## 2022-10-06 DIAGNOSIS — L659 Nonscarring hair loss, unspecified: Secondary | ICD-10-CM | POA: Diagnosis not present

## 2022-10-06 DIAGNOSIS — R635 Abnormal weight gain: Secondary | ICD-10-CM | POA: Diagnosis not present

## 2022-11-03 DIAGNOSIS — R5383 Other fatigue: Secondary | ICD-10-CM | POA: Diagnosis not present

## 2022-11-03 DIAGNOSIS — F410 Panic disorder [episodic paroxysmal anxiety] without agoraphobia: Secondary | ICD-10-CM | POA: Diagnosis not present

## 2022-11-03 DIAGNOSIS — K582 Mixed irritable bowel syndrome: Secondary | ICD-10-CM | POA: Diagnosis not present

## 2022-11-03 DIAGNOSIS — L659 Nonscarring hair loss, unspecified: Secondary | ICD-10-CM | POA: Diagnosis not present

## 2022-11-22 DIAGNOSIS — M2550 Pain in unspecified joint: Secondary | ICD-10-CM | POA: Diagnosis not present

## 2022-11-24 DIAGNOSIS — K582 Mixed irritable bowel syndrome: Secondary | ICD-10-CM | POA: Diagnosis not present

## 2022-11-24 DIAGNOSIS — R109 Unspecified abdominal pain: Secondary | ICD-10-CM | POA: Diagnosis not present

## 2022-12-02 DIAGNOSIS — Z79899 Other long term (current) drug therapy: Secondary | ICD-10-CM | POA: Diagnosis not present

## 2022-12-02 DIAGNOSIS — F902 Attention-deficit hyperactivity disorder, combined type: Secondary | ICD-10-CM | POA: Diagnosis not present

## 2022-12-02 DIAGNOSIS — F0631 Mood disorder due to known physiological condition with depressive features: Secondary | ICD-10-CM | POA: Diagnosis not present

## 2022-12-02 DIAGNOSIS — F411 Generalized anxiety disorder: Secondary | ICD-10-CM | POA: Diagnosis not present

## 2022-12-21 DIAGNOSIS — R002 Palpitations: Secondary | ICD-10-CM | POA: Diagnosis not present

## 2023-03-03 DIAGNOSIS — F411 Generalized anxiety disorder: Secondary | ICD-10-CM | POA: Diagnosis not present

## 2023-03-03 DIAGNOSIS — F902 Attention-deficit hyperactivity disorder, combined type: Secondary | ICD-10-CM | POA: Diagnosis not present

## 2023-03-03 DIAGNOSIS — Z79899 Other long term (current) drug therapy: Secondary | ICD-10-CM | POA: Diagnosis not present

## 2023-03-03 DIAGNOSIS — F3341 Major depressive disorder, recurrent, in partial remission: Secondary | ICD-10-CM | POA: Diagnosis not present

## 2023-03-08 DIAGNOSIS — L718 Other rosacea: Secondary | ICD-10-CM | POA: Diagnosis not present

## 2023-03-08 DIAGNOSIS — L309 Dermatitis, unspecified: Secondary | ICD-10-CM | POA: Diagnosis not present

## 2023-03-10 DIAGNOSIS — Z6826 Body mass index (BMI) 26.0-26.9, adult: Secondary | ICD-10-CM | POA: Diagnosis not present

## 2023-03-10 DIAGNOSIS — Z01419 Encounter for gynecological examination (general) (routine) without abnormal findings: Secondary | ICD-10-CM | POA: Diagnosis not present

## 2023-03-10 DIAGNOSIS — Z3009 Encounter for other general counseling and advice on contraception: Secondary | ICD-10-CM | POA: Diagnosis not present

## 2023-06-02 DIAGNOSIS — F902 Attention-deficit hyperactivity disorder, combined type: Secondary | ICD-10-CM | POA: Diagnosis not present

## 2023-06-02 DIAGNOSIS — Z79899 Other long term (current) drug therapy: Secondary | ICD-10-CM | POA: Diagnosis not present

## 2023-06-02 DIAGNOSIS — F411 Generalized anxiety disorder: Secondary | ICD-10-CM | POA: Diagnosis not present

## 2023-06-02 DIAGNOSIS — F3341 Major depressive disorder, recurrent, in partial remission: Secondary | ICD-10-CM | POA: Diagnosis not present

## 2023-06-15 DIAGNOSIS — M79602 Pain in left arm: Secondary | ICD-10-CM | POA: Diagnosis not present

## 2023-06-15 DIAGNOSIS — J984 Other disorders of lung: Secondary | ICD-10-CM | POA: Diagnosis not present

## 2023-06-15 DIAGNOSIS — R0789 Other chest pain: Secondary | ICD-10-CM | POA: Diagnosis not present

## 2023-06-15 DIAGNOSIS — I498 Other specified cardiac arrhythmias: Secondary | ICD-10-CM | POA: Diagnosis not present

## 2023-06-15 DIAGNOSIS — R918 Other nonspecific abnormal finding of lung field: Secondary | ICD-10-CM | POA: Diagnosis not present

## 2023-06-15 DIAGNOSIS — Z5321 Procedure and treatment not carried out due to patient leaving prior to being seen by health care provider: Secondary | ICD-10-CM | POA: Diagnosis not present

## 2023-06-15 DIAGNOSIS — M542 Cervicalgia: Secondary | ICD-10-CM | POA: Diagnosis not present

## 2023-06-15 DIAGNOSIS — R9431 Abnormal electrocardiogram [ECG] [EKG]: Secondary | ICD-10-CM | POA: Diagnosis not present

## 2023-06-21 DIAGNOSIS — D225 Melanocytic nevi of trunk: Secondary | ICD-10-CM | POA: Diagnosis not present

## 2023-06-21 DIAGNOSIS — L718 Other rosacea: Secondary | ICD-10-CM | POA: Diagnosis not present

## 2023-06-21 DIAGNOSIS — L578 Other skin changes due to chronic exposure to nonionizing radiation: Secondary | ICD-10-CM | POA: Diagnosis not present

## 2023-06-21 DIAGNOSIS — L918 Other hypertrophic disorders of the skin: Secondary | ICD-10-CM | POA: Diagnosis not present

## 2023-07-12 DIAGNOSIS — J019 Acute sinusitis, unspecified: Secondary | ICD-10-CM | POA: Diagnosis not present

## 2023-07-12 DIAGNOSIS — I10 Essential (primary) hypertension: Secondary | ICD-10-CM | POA: Diagnosis not present

## 2023-07-12 DIAGNOSIS — F3341 Major depressive disorder, recurrent, in partial remission: Secondary | ICD-10-CM | POA: Diagnosis not present

## 2023-07-12 DIAGNOSIS — F411 Generalized anxiety disorder: Secondary | ICD-10-CM | POA: Diagnosis not present

## 2023-08-25 DIAGNOSIS — F411 Generalized anxiety disorder: Secondary | ICD-10-CM | POA: Diagnosis not present

## 2023-08-25 DIAGNOSIS — F3341 Major depressive disorder, recurrent, in partial remission: Secondary | ICD-10-CM | POA: Diagnosis not present

## 2023-08-25 DIAGNOSIS — Z1331 Encounter for screening for depression: Secondary | ICD-10-CM | POA: Diagnosis not present

## 2023-08-25 DIAGNOSIS — Z13220 Encounter for screening for lipoid disorders: Secondary | ICD-10-CM | POA: Diagnosis not present

## 2023-08-25 DIAGNOSIS — Z0000 Encounter for general adult medical examination without abnormal findings: Secondary | ICD-10-CM | POA: Diagnosis not present

## 2023-08-25 DIAGNOSIS — I10 Essential (primary) hypertension: Secondary | ICD-10-CM | POA: Diagnosis not present

## 2023-08-25 DIAGNOSIS — Z23 Encounter for immunization: Secondary | ICD-10-CM | POA: Diagnosis not present

## 2023-08-29 DIAGNOSIS — F411 Generalized anxiety disorder: Secondary | ICD-10-CM | POA: Diagnosis not present

## 2023-08-29 DIAGNOSIS — F325 Major depressive disorder, single episode, in full remission: Secondary | ICD-10-CM | POA: Diagnosis not present

## 2023-08-29 DIAGNOSIS — Z79899 Other long term (current) drug therapy: Secondary | ICD-10-CM | POA: Diagnosis not present

## 2023-08-29 DIAGNOSIS — F902 Attention-deficit hyperactivity disorder, combined type: Secondary | ICD-10-CM | POA: Diagnosis not present

## 2023-08-29 DIAGNOSIS — I10 Essential (primary) hypertension: Secondary | ICD-10-CM | POA: Diagnosis not present

## 2023-08-29 DIAGNOSIS — Z1331 Encounter for screening for depression: Secondary | ICD-10-CM | POA: Diagnosis not present

## 2023-11-29 DIAGNOSIS — F330 Major depressive disorder, recurrent, mild: Secondary | ICD-10-CM | POA: Diagnosis not present

## 2023-11-29 DIAGNOSIS — F411 Generalized anxiety disorder: Secondary | ICD-10-CM | POA: Diagnosis not present

## 2023-11-29 DIAGNOSIS — Z1331 Encounter for screening for depression: Secondary | ICD-10-CM | POA: Diagnosis not present

## 2023-11-29 DIAGNOSIS — F902 Attention-deficit hyperactivity disorder, combined type: Secondary | ICD-10-CM | POA: Diagnosis not present

## 2023-11-29 DIAGNOSIS — Z79899 Other long term (current) drug therapy: Secondary | ICD-10-CM | POA: Diagnosis not present

## 2023-12-19 DIAGNOSIS — Z808 Family history of malignant neoplasm of other organs or systems: Secondary | ICD-10-CM | POA: Diagnosis not present

## 2023-12-19 DIAGNOSIS — L821 Other seborrheic keratosis: Secondary | ICD-10-CM | POA: Diagnosis not present

## 2023-12-19 DIAGNOSIS — C44612 Basal cell carcinoma of skin of right upper limb, including shoulder: Secondary | ICD-10-CM | POA: Diagnosis not present

## 2023-12-19 DIAGNOSIS — D485 Neoplasm of uncertain behavior of skin: Secondary | ICD-10-CM | POA: Diagnosis not present

## 2023-12-21 DIAGNOSIS — Z1239 Encounter for other screening for malignant neoplasm of breast: Secondary | ICD-10-CM | POA: Diagnosis not present

## 2023-12-21 DIAGNOSIS — I10 Essential (primary) hypertension: Secondary | ICD-10-CM | POA: Diagnosis not present

## 2023-12-29 DIAGNOSIS — I10 Essential (primary) hypertension: Secondary | ICD-10-CM | POA: Diagnosis not present

## 2023-12-29 DIAGNOSIS — K219 Gastro-esophageal reflux disease without esophagitis: Secondary | ICD-10-CM | POA: Diagnosis not present

## 2024-01-20 DIAGNOSIS — F411 Generalized anxiety disorder: Secondary | ICD-10-CM | POA: Diagnosis not present

## 2024-01-20 DIAGNOSIS — F902 Attention-deficit hyperactivity disorder, combined type: Secondary | ICD-10-CM | POA: Diagnosis not present

## 2024-01-20 DIAGNOSIS — Z566 Other physical and mental strain related to work: Secondary | ICD-10-CM | POA: Diagnosis not present

## 2024-01-20 DIAGNOSIS — F332 Major depressive disorder, recurrent severe without psychotic features: Secondary | ICD-10-CM | POA: Diagnosis not present

## 2024-03-20 DIAGNOSIS — L239 Allergic contact dermatitis, unspecified cause: Secondary | ICD-10-CM | POA: Diagnosis not present

## 2024-04-02 DIAGNOSIS — F902 Attention-deficit hyperactivity disorder, combined type: Secondary | ICD-10-CM | POA: Diagnosis not present

## 2024-04-02 DIAGNOSIS — F330 Major depressive disorder, recurrent, mild: Secondary | ICD-10-CM | POA: Diagnosis not present

## 2024-04-02 DIAGNOSIS — F411 Generalized anxiety disorder: Secondary | ICD-10-CM | POA: Diagnosis not present
